# Patient Record
Sex: Female | Born: 1970
Health system: Southern US, Community
[De-identification: ages and names within clinical notes are randomized; demographics above are authoritative.]

## PROBLEM LIST (undated history)

## (undated) DIAGNOSIS — J309 Allergic rhinitis, unspecified: Secondary | ICD-10-CM

## (undated) HISTORY — DX: Allergic rhinitis, unspecified: J30.9

## (undated) HISTORY — PX: CHOLECYSTECTOMY: SHX55

## (undated) HISTORY — PX: WISDOM TOOTH EXTRACTION: SHX21

## (undated) HISTORY — PX: FOOT SURGERY: SHX648

---

## 2007-11-06 ENCOUNTER — Ambulatory Visit: Payer: Self-pay

## 2007-11-08 ENCOUNTER — Inpatient Hospital Stay: Payer: Self-pay | Admitting: Obstetrics and Gynecology

## 2008-04-24 ENCOUNTER — Ambulatory Visit: Payer: Self-pay | Admitting: General Surgery

## 2008-04-25 ENCOUNTER — Ambulatory Visit: Payer: Self-pay | Admitting: General Surgery

## 2008-06-03 ENCOUNTER — Ambulatory Visit: Payer: Self-pay | Admitting: General Surgery

## 2008-09-12 DIAGNOSIS — F341 Dysthymic disorder: Secondary | ICD-10-CM | POA: Insufficient documentation

## 2008-10-25 DIAGNOSIS — L679 Hair color and hair shaft abnormality, unspecified: Secondary | ICD-10-CM | POA: Insufficient documentation

## 2009-04-18 ENCOUNTER — Ambulatory Visit: Payer: Self-pay | Admitting: Family Medicine

## 2009-04-18 DIAGNOSIS — M545 Low back pain, unspecified: Secondary | ICD-10-CM | POA: Insufficient documentation

## 2009-07-03 DIAGNOSIS — J309 Allergic rhinitis, unspecified: Secondary | ICD-10-CM | POA: Insufficient documentation

## 2014-11-25 ENCOUNTER — Other Ambulatory Visit: Payer: Self-pay | Admitting: Family Medicine

## 2014-11-25 DIAGNOSIS — L0293 Carbuncle, unspecified: Secondary | ICD-10-CM

## 2014-11-25 MED ORDER — DOXYCYCLINE HYCLATE 100 MG PO TABS: 100.0000 mg | ORAL_TABLET | Freq: Two times a day (BID) | ORAL | Status: DC

## 2015-01-07 ENCOUNTER — Encounter: Payer: Self-pay | Admitting: Family Medicine

## 2015-01-07 ENCOUNTER — Ambulatory Visit (INDEPENDENT_AMBULATORY_CARE_PROVIDER_SITE_OTHER): Payer: Managed Care, Other (non HMO) | Admitting: Family Medicine

## 2015-01-07 DIAGNOSIS — L0293 Carbuncle, unspecified: Secondary | ICD-10-CM

## 2015-01-07 DIAGNOSIS — L02429 Furuncle of limb, unspecified: Secondary | ICD-10-CM | POA: Insufficient documentation

## 2015-01-07 LAB — POCT GLYCOSYLATED HEMOGLOBIN (HGB A1C): Hemoglobin A1C: 5.2

## 2015-01-07 MED ORDER — DOXYCYCLINE HYCLATE 100 MG PO TABS: 100.0000 mg | ORAL_TABLET | Freq: Two times a day (BID) | ORAL | Status: DC

## 2015-01-07 NOTE — Patient Instructions (Signed)
We can consider infectious disease referral if you wish.

## 2015-01-07 NOTE — Progress Notes (Signed)
Subjective:     Patient ID: Laurie Wilkins, female   DOB: [birth year], 44 y.o.   MRN: [id number]  HPI  Chief Complaint  Patient presents with  . Skin Check    Patient comes in office today with concerns of possible boil on left hip. She states that she first noticed boil on 09/16 and she had popped it and it had drained. Patient reports that site continues to drain and now there is a hole present at site. Patient does have a history of MRSA  Last two cultures have discovered Non-MRSA/PCN resistant staph.States her husband is not flaring with his boils at this time. She is frustrated with recurrent boils and wishes further investigation.   Review of Systems  Constitutional: Negative for fever and chills.       Objective:   Physical Exam  Constitutional: She appears well-developed and well-nourished. No distress.  Skin:  Open carbuncle on left flank with underlying induration but no no drainage.       Assessment:    1. Carbuncle - doxycycline (VIBRA-TABS) 100 MG tablet; Take 1 tablet (100 mg total) by mouth 2 (two) times daily.  Dispense: 14 tablet; Refill: 0 - POCT glycosylated hemoglobin (Hb A1C) - CBC with Differential/Platelet    Plan:    Consider I.D. Referral.

## 2015-04-30 ENCOUNTER — Ambulatory Visit (INDEPENDENT_AMBULATORY_CARE_PROVIDER_SITE_OTHER): Payer: Managed Care, Other (non HMO) | Admitting: Family Medicine

## 2015-04-30 ENCOUNTER — Encounter: Payer: Self-pay | Admitting: Family Medicine

## 2015-04-30 VITALS — BP 104/70 | HR 58 | Temp 98.1°F | Resp 16 | Wt 218.6 lb

## 2015-04-30 DIAGNOSIS — S86112A Strain of other muscle(s) and tendon(s) of posterior muscle group at lower leg level, left leg, initial encounter: Secondary | ICD-10-CM | POA: Diagnosis not present

## 2015-04-30 NOTE — Progress Notes (Signed)
Subjective:     Patient ID: Laurie Wilkins, female   DOB: 1970/04/29, 45 y.o.   MRN: [id number]  HPI  Chief Complaint  Patient presents with  . Leg Pain    Patient comes in office today with concerns of leg pain on her left side for the past 6 weeks. Patient states that she has been actively exercising 5x a week doing bootcamp class at gym and spin class. Patient describes pain as throbbing she had been taking otc Aleve and Ibuprofen and took a break from exercising to see if symptoms improved. Patient states that last week she returned back to full exercise 5x a week and states that pain has returned and now she has noticed bruising on her leg   Reports additional misadventures with her left leg over the course of the last two weeks: walking in the snow and her leg slipping and dropping while shaving.   Review of Systems     Objective:   Physical Exam  Cardiovascular:  Pulses:      Dorsalis pedis pulses are 2+ on the left side.       Posterior tibial pulses are 2+ on the left side.  Musculoskeletal: She exhibits no edema (of lower extremity).  KF/KE/HF/HE 5/5. Tender over her left lateral posterior calf which causes pain to radiate up her hamstring. No ischial tenderness. Increased pain with resistance to knee flexion.       Assessment:    1. Gastrocnemius strain, left, initial encounter     Plan:    Discussed use of nsaid's, heat, and cross training.

## 2015-04-30 NOTE — Patient Instructions (Addendum)
Discussed taking two Aleve twice daily with food. Consider heat for 20 minutes several x day. Do upper body exercises only for at least a week.

## 2015-05-07 ENCOUNTER — Telehealth: Payer: Self-pay | Admitting: Family Medicine

## 2015-05-07 ENCOUNTER — Other Ambulatory Visit: Payer: Self-pay | Admitting: Family Medicine

## 2015-05-07 DIAGNOSIS — M25562 Pain in left knee: Secondary | ICD-10-CM

## 2015-05-07 NOTE — Telephone Encounter (Signed)
Referral in progress. 

## 2015-05-07 NOTE — Telephone Encounter (Signed)
Pt's husband Brett Canales called stating that pt is still having pain in left leg/thigh.She is wanting a referral to Dr Erin Sons.Call back #  [phone]

## 2015-05-07 NOTE — Telephone Encounter (Signed)
Patient has been notified of referral. Laurie Wilkins

## 2015-05-12 ENCOUNTER — Other Ambulatory Visit: Payer: Self-pay | Admitting: Unknown Physician Specialty

## 2015-05-12 DIAGNOSIS — M25552 Pain in left hip: Principal | ICD-10-CM

## 2015-05-12 DIAGNOSIS — G8929 Other chronic pain: Secondary | ICD-10-CM

## 2015-05-12 DIAGNOSIS — M79605 Pain in left leg: Secondary | ICD-10-CM

## 2015-05-16 ENCOUNTER — Ambulatory Visit
Admission: RE | Admit: 2015-05-16 | Discharge: 2015-05-16 | Disposition: A | Discharge status: Home / Self Care | Payer: Managed Care, Other (non HMO) | Source: Ambulatory Visit | Attending: Unknown Physician Specialty | Admitting: Unknown Physician Specialty

## 2015-05-16 DIAGNOSIS — M79605 Pain in left leg: Secondary | ICD-10-CM | POA: Diagnosis not present

## 2015-05-16 DIAGNOSIS — M4806 Spinal stenosis, lumbar region: Secondary | ICD-10-CM | POA: Insufficient documentation

## 2015-05-16 DIAGNOSIS — G8929 Other chronic pain: Secondary | ICD-10-CM | POA: Insufficient documentation

## 2015-05-16 DIAGNOSIS — M25552 Pain in left hip: Secondary | ICD-10-CM | POA: Diagnosis present

## 2015-05-16 DIAGNOSIS — M4690 Unspecified inflammatory spondylopathy, site unspecified: Secondary | ICD-10-CM | POA: Insufficient documentation

## 2015-05-30 ENCOUNTER — Other Ambulatory Visit: Payer: Self-pay

## 2015-05-30 ENCOUNTER — Ambulatory Visit: Payer: Self-pay

## 2016-10-20 ENCOUNTER — Ambulatory Visit (INDEPENDENT_AMBULATORY_CARE_PROVIDER_SITE_OTHER): Payer: Managed Care, Other (non HMO) | Admitting: Physician Assistant

## 2016-10-20 ENCOUNTER — Encounter: Payer: Self-pay | Admitting: Physician Assistant

## 2016-10-20 VITALS — BP 122/80 | HR 84 | Temp 98.7°F | Resp 16 | Wt 225.8 lb

## 2016-10-20 DIAGNOSIS — J0100 Acute maxillary sinusitis, unspecified: Secondary | ICD-10-CM

## 2016-10-20 DIAGNOSIS — A4902 Methicillin resistant Staphylococcus aureus infection, unspecified site: Secondary | ICD-10-CM | POA: Diagnosis not present

## 2016-10-20 NOTE — Progress Notes (Signed)
Patient: Laurie Wilkins Female    DOB: 1971/03/27   46 y.o.   MRN: [id number] Visit Date: 10/20/2016  Today's Provider: Margaretann LovelessJennifer M Casimiro Lienhard, PA-C   Chief Complaint  Patient presents with  . Sinusitis   Subjective:    Sinusitis  This is a new problem. The current episode started 1 to 4 weeks ago (Last Monday). The problem has been gradually worsening since onset. There has been no fever. Associated symptoms include congestion, coughing, diaphoresis, ear pain (pressure on left ear ), headaches, a hoarse voice, sinus pressure, sneezing, a sore throat and swollen glands. Pertinent negatives include no chills or shortness of breath. Treatments tried: Meloxicam,Loratadine tablets 10mg , Mupirocin ointment in her nose and on the bump that appeared underneath armpiit. The treatment provided no relief.       Allergies  Allergen Reactions  . Naproxen Sodium     Other reaction(s): Abdominal Pain Bleeding in bowels and bladder  . Erythromycin Nausea And Vomiting     Current Outpatient Prescriptions:  .  meloxicam (MOBIC) 7.5 MG tablet, TAKE 1 TABLET (7.5 MG TOTAL) BY MOUTH ONCE DAILY., Disp: , Rfl:  .  Multiple Vitamins-Minerals (MULTIVITAL-M) TABS, Take by mouth., Disp: , Rfl:  .  ferrous sulfate 325 (65 FE) MG tablet, Take by mouth., Disp: , Rfl:  .  HYDROcodone-acetaminophen (NORCO/VICODIN) 5-325 MG tablet, 1 po bid prn, Disp: , Rfl:   Review of Systems  Constitutional: Positive for diaphoresis and fatigue. Negative for chills and fever.  HENT: Positive for congestion, ear pain (pressure on left ear ), hoarse voice, postnasal drip, rhinorrhea, sinus pain, sinus pressure, sneezing, sore throat, trouble swallowing and voice change.   Respiratory: Positive for cough. Negative for chest tightness, shortness of breath and wheezing.   Cardiovascular: Negative for chest pain, palpitations and leg swelling.  Gastrointestinal: Positive for diarrhea (today). Negative for nausea and  vomiting.  Skin: Positive for rash (hx of MRSA).  Neurological: Positive for headaches.    Social History  Substance Use Topics  . Smoking status: Never Smoker  . Smokeless tobacco: Never Used  . Alcohol use No   Objective:   BP 122/80 (BP Location: Right Arm, Patient Position: Sitting, Cuff Size: Large)   Pulse 84   Temp 98.7 F (37.1 C) (Oral)   Resp 16   Wt 225 lb 12.8 oz (102.4 kg)   LMP 09/17/2016  Vitals:   10/20/16 1527  BP: 122/80  Pulse: 84  Resp: 16  Temp: 98.7 F (37.1 C)  TempSrc: Oral  Weight: 225 lb 12.8 oz (102.4 kg)     Physical Exam  Constitutional: She appears well-developed and well-nourished. No distress.  HENT:  Head: Normocephalic and atraumatic.  Right Ear: Hearing, tympanic membrane, external ear and ear canal normal.  Left Ear: Hearing, tympanic membrane, external ear and ear canal normal.  Nose: Right sinus exhibits maxillary sinus tenderness and frontal sinus tenderness. Left sinus exhibits maxillary sinus tenderness and frontal sinus tenderness.  Mouth/Throat: Uvula is midline, oropharynx is clear and moist and mucous membranes are normal. No oropharyngeal exudate.  Neck: Normal range of motion. Neck supple. No tracheal deviation present. No thyromegaly present.  Cardiovascular: Normal rate, regular rhythm and normal heart sounds.  Exam reveals no gallop and no friction rub.   No murmur heard. Pulmonary/Chest: Effort normal and breath sounds normal. No stridor. No respiratory distress. She has no wheezes. She has no rales.  Lymphadenopathy:    She has no cervical adenopathy.  Skin: She is not diaphoretic.  Vitals reviewed.       Assessment & Plan:     1. Acute maxillary sinusitis, recurrence not specified Worsening symptoms that have not responded to OTC medications. Will give augmentin as below. Continue allergy medications. Stay well hydrated and get plenty of rest. Call if no symptom improvement or if symptoms worsen.  Epic went  down during visit and she was given written Rx for augmentin.   2. Infection with methicillin-resistant Staphylococcus aureus Written Rx for Mupirocin 2% ointment given for refill. This has been working well when she has outbreaks and she has been using in her nose.        Margaretann Loveless, PA-C  [facility]

## 2016-12-09 ENCOUNTER — Ambulatory Visit (INDEPENDENT_AMBULATORY_CARE_PROVIDER_SITE_OTHER): Payer: Managed Care, Other (non HMO) | Admitting: Physician Assistant

## 2016-12-09 VITALS — BP 128/78 | HR 92 | Temp 98.1°F | Resp 16 | Ht 65.0 in | Wt 232.0 lb

## 2016-12-09 DIAGNOSIS — N3000 Acute cystitis without hematuria: Secondary | ICD-10-CM | POA: Diagnosis not present

## 2016-12-09 LAB — POCT URINALYSIS DIPSTICK
Bilirubin, UA: NEGATIVE
Blood, UA: NEGATIVE
Glucose, UA: NEGATIVE
KETONES UA: NEGATIVE
Nitrite, UA: NEGATIVE
PROTEIN UA: NEGATIVE
Spec Grav, UA: <=1.005 (ref 1.010–1.025)
Urobilinogen, UA: 0.2 E.U./dL
pH, UA: 6 (ref 5.0–8.0)

## 2016-12-09 MED ORDER — PHENAZOPYRIDINE HCL 100 MG PO TABS: 100.0000 mg | ORAL_TABLET | Freq: Three times a day (TID) | ORAL | 0 refills | Status: DC | PRN

## 2016-12-09 MED ORDER — SULFAMETHOXAZOLE-TRIMETHOPRIM 800-160 MG PO TABS: 1.0000 | ORAL_TABLET | Freq: Two times a day (BID) | ORAL | 0 refills | Status: DC

## 2016-12-09 NOTE — Patient Instructions (Signed)

## 2016-12-09 NOTE — Progress Notes (Signed)
Patient: Laurie Wilkins Female    DOB: Nov 26, 1970   46 y.o.   MRN: [id number] Visit Date: 12/09/2016  Today's Provider: Margaretann Loveless, PA-C   Chief Complaint  Patient presents with  . Urinary Tract Infection   Subjective:    HPI Urinary Tract Infection: Patient complains of frequency She has had symptoms for 4 days. Patient also complains of back pain. Patient denies fever and vaginal discharge. Patient does not have a history of recurrent UTI.  Patient does not have a history of pyelonephritis. Patient reports she did a home urine test and turned out positive for white cells. Patient reports back pain is intense and worse with activity. Patient reports taking OTC Tylenol and Ibuprofen reports no relief.      Allergies  Allergen Reactions  . Naproxen Sodium     Other reaction(s): Abdominal Pain Bleeding in bowels and bladder  . Erythromycin Nausea And Vomiting     Current Outpatient Prescriptions:  .  ferrous sulfate 325 (65 FE) MG tablet, Take by mouth., Disp: , Rfl:  .  meloxicam (MOBIC) 7.5 MG tablet, TAKE 1 TABLET (7.5 MG TOTAL) BY MOUTH ONCE DAILY., Disp: , Rfl:  .  Multiple Vitamins-Minerals (MULTIVITAL-M) TABS, Take by mouth., Disp: , Rfl:   Review of Systems  Constitutional: Negative.   Respiratory: Negative.   Cardiovascular: Negative.   Gastrointestinal: Negative.   Genitourinary: Positive for frequency and urgency.  Musculoskeletal: Positive for back pain.    Social History  Substance Use Topics  . Smoking status: Never Smoker  . Smokeless tobacco: Never Used  . Alcohol use No   Objective:   BP 128/78 (BP Location: Left Arm, Patient Position: Sitting, Cuff Size: Large)   Pulse 92   Temp 98.1 F (36.7 C) (Oral)   Resp 16   Ht 5\' 5"  (1.651 m)   Wt 232 lb (105.2 kg)   LMP 11/21/2016 (Exact Date)   SpO2 97%   BMI 38.61 kg/m  Vitals:   12/09/16 1557  BP: 128/78  Pulse: 92  Resp: 16  Temp: 98.1 F (36.7 C)  TempSrc: Oral  SpO2:  97%  Weight: 232 lb (105.2 kg)  Height: 5\' 5"  (1.651 m)     Physical Exam  Constitutional: She is oriented to person, place, and time. She appears well-developed and well-nourished. No distress.  Cardiovascular: Normal rate, regular rhythm and normal heart sounds.  Exam reveals no gallop and no friction rub.   No murmur heard. Pulmonary/Chest: Effort normal and breath sounds normal. No respiratory distress. She has no wheezes. She has no rales.  Abdominal: Soft. Normal appearance and bowel sounds are normal. She exhibits no distension and no mass. There is no hepatosplenomegaly. There is tenderness in the suprapubic area. There is no rebound, no guarding and no CVA tenderness.  Neurological: She is alert and oriented to person, place, and time.  Skin: Skin is warm and dry. She is not diaphoretic.  Vitals reviewed.      Assessment & Plan:     1. Acute cystitis without hematuria Worsening symptoms. UA positive. Will treat empirically with Bactrim as below. Pyridium given for spasm. Continue to push fluids. Urine sent for culture. Will follow up pending C&S results. She is to call if symptoms do not improve or if they worsen.  - sulfamethoxazole-trimethoprim (BACTRIM DS,SEPTRA DS) 800-160 MG tablet; Take 1 tablet by mouth 2 (two) times daily.  Dispense: 14 tablet; Refill: 0 - phenazopyridine (PYRIDIUM) 100 MG  tablet; Take 1 tablet (100 mg total) by mouth 3 (three) times daily as needed for pain.  Dispense: 10 tablet; Refill: 0 - Urine Culture       Margaretann LovelessJennifer M Kamori Kitchens, PA-C  [facility]

## 2016-12-13 LAB — URINE CULTURE

## 2016-12-14 ENCOUNTER — Telehealth: Payer: Self-pay

## 2016-12-14 NOTE — Telephone Encounter (Signed)
Patient advised as directed below.  Thanks,  -Joseline 

## 2016-12-14 NOTE — Telephone Encounter (Signed)
-----   Message from Margaretann LovelessJennifer M Burnette, [place]PA-C sent at 12/14/2016  8:27 AM EDT ----- Urine culture was positive for strep bacteria but should be susceptible to the antibiotic you were placed on. Continue until completed and call if symptoms do not resolve.

## 2017-01-26 ENCOUNTER — Ambulatory Visit
Admission: RE | Admit: 2017-01-26 | Discharge: 2017-01-26 | Disposition: A | Discharge status: Home / Self Care | Payer: Managed Care, Other (non HMO) | Source: Ambulatory Visit | Attending: Physician Assistant | Admitting: Physician Assistant

## 2017-01-26 ENCOUNTER — Ambulatory Visit (INDEPENDENT_AMBULATORY_CARE_PROVIDER_SITE_OTHER): Payer: Managed Care, Other (non HMO) | Admitting: Physician Assistant

## 2017-01-26 VITALS — BP 126/88 | HR 84 | Temp 98.2°F | Resp 16 | Wt 227.8 lb

## 2017-01-26 DIAGNOSIS — R197 Diarrhea, unspecified: Secondary | ICD-10-CM | POA: Insufficient documentation

## 2017-01-26 DIAGNOSIS — N3000 Acute cystitis without hematuria: Secondary | ICD-10-CM | POA: Diagnosis not present

## 2017-01-26 DIAGNOSIS — R110 Nausea: Secondary | ICD-10-CM

## 2017-01-26 DIAGNOSIS — R1032 Left lower quadrant pain: Secondary | ICD-10-CM | POA: Diagnosis not present

## 2017-01-26 DIAGNOSIS — R309 Painful micturition, unspecified: Secondary | ICD-10-CM

## 2017-01-26 LAB — POCT URINALYSIS DIPSTICK
Bilirubin, UA: NEGATIVE
Glucose, UA: NEGATIVE
KETONES UA: NEGATIVE
Nitrite, UA: NEGATIVE
PH UA: 6 (ref 5.0–8.0)
PROTEIN UA: NEGATIVE
SPEC GRAV UA: 1.01 (ref 1.010–1.025)
Urobilinogen, UA: 0.2 E.U./dL

## 2017-01-26 MED ORDER — NITROFURANTOIN MONOHYD MACRO 100 MG PO CAPS: 100.0000 mg | ORAL_CAPSULE | Freq: Two times a day (BID) | ORAL | 0 refills | Status: DC

## 2017-01-26 NOTE — Progress Notes (Signed)
Patient: Laurie Wilkins Female    DOB: 05-Sep-1970   46 y.o.   MRN: [id number] Visit Date: 01/26/2017  Today's Provider: Margaretann Loveless, PA-C   Chief Complaint  Patient presents with  . Urinary Frequency   Subjective:    HPI  Patient states she thinks she has another UTI. Her last one was on 12/09/16 for strep b infection. She did a course of antibiotic at that time and did feel better after. Current symptoms started on Friday 01/21/17 with nausea and vomiting, by Monday 01/24/17 developed urinary frequency, urgency, burning, back pain, bloated sensation, sweat at night time and headache now. She is still having nausea symptoms and not eating as much due to this. Her last menstrual cycle ended on 01/21/17.   She also complains of LLQ pain, diarrhea and left flank pain.     Allergies  Allergen Reactions  . Naproxen Sodium     Other reaction(s): Abdominal Pain Bleeding in bowels and bladder  . Erythromycin Nausea And Vomiting     Current Outpatient Prescriptions:  .  ferrous sulfate 325 (65 FE) MG tablet, Take by mouth., Disp: , Rfl:  .  meloxicam (MOBIC) 7.5 MG tablet, TAKE 1 TABLET (7.5 MG TOTAL) BY MOUTH ONCE DAILY., Disp: , Rfl:  .  Multiple Vitamins-Minerals (MULTIVITAL-M) TABS, Take by mouth., Disp: , Rfl:   Review of Systems  Constitutional: Positive for fatigue.  Respiratory: Negative.   Cardiovascular: Negative.   Gastrointestinal: Positive for abdominal distention, abdominal pain, diarrhea, nausea and vomiting.  Genitourinary: Positive for dysuria, flank pain, frequency and urgency. Negative for vaginal bleeding, vaginal discharge and vaginal pain.  Musculoskeletal: Positive for back pain.    Social History  Substance Use Topics  . Smoking status: Never Smoker  . Smokeless tobacco: Never Used  . Alcohol use No   Objective:   BP 126/88   Pulse 84   Temp 98.2 F (36.8 C)   Resp 16   Wt 227 lb 12.8 oz (103.3 kg)   LMP 01/16/2017   BMI 37.91  kg/m  Vitals:   01/26/17 1454  BP: 126/88  Pulse: 84  Resp: 16  Temp: 98.2 F (36.8 C)  Weight: 227 lb 12.8 oz (103.3 kg)     Physical Exam  Constitutional: She is oriented to person, place, and time. She appears well-developed and well-nourished. No distress.  Cardiovascular: Normal rate, regular rhythm and normal heart sounds.  Exam reveals no gallop and no friction rub.   No murmur heard. Pulmonary/Chest: Effort normal and breath sounds normal. No respiratory distress. She has no wheezes. She has no rales.  Abdominal: Soft. Normal appearance and bowel sounds are normal. She exhibits no distension and no mass. There is no hepatosplenomegaly. There is tenderness in the left lower quadrant. There is CVA tenderness (left side). There is no rebound and no guarding.  Neurological: She is alert and oriented to person, place, and time.  Skin: Skin is warm and dry. She is not diaphoretic.  Vitals reviewed.      Assessment & Plan:     1. Acute cystitis without hematuria Worsening symptoms. UA positive. Will treat empirically with Macrobid as below. Continue to push fluids. Urine sent for culture. Will follow up pending C&S results. She is to call if symptoms do not improve or if they worsen.  - Urine Culture - nitrofurantoin, macrocrystal-monohydrate, (MACROBID) 100 MG capsule; Take 1 capsule (100 mg total) by mouth 2 (two) times daily.  Dispense:  14 capsule; Refill: 0  2. Painful urination UA positive for small leuks and NH trace. - POCT urinalysis dipstick  3. LLQ pain LLQ tenderness without guarding on exam. Will order xray as below to r/o diverticulitis vs renal stone as possible cause of these symptoms. May possibly be caused by UTI, but want to r/o other causes.   - DG Abd 2 Views; Future  4. Nausea See above medical treatment plan. - DG Abd 2 Views; Future  5. Diarrhea, unspecified type See above medical treatment plan. - DG Abd 2 Views; Future       Margaretann LovelessJennifer M  Wrigley Winborne, PA-C  [facility]

## 2017-01-26 NOTE — Patient Instructions (Signed)

## 2017-01-28 LAB — URINE CULTURE
MICRO NUMBER:: 81160368
SPECIMEN QUALITY:: ADEQUATE

## 2017-01-31 ENCOUNTER — Telehealth: Payer: Self-pay

## 2017-01-31 NOTE — Telephone Encounter (Signed)
-----   Message from Margaretann LovelessJennifer M Burnette, [place]PA-C sent at 01/30/2017  6:35 PM EDT ----- Urine culture positive for group B strep again. Should be sensitive to the antibiotic but call if symptoms have not improved completely.

## 2017-01-31 NOTE — Telephone Encounter (Signed)
Patient advised as below. Patient reports symptoms are improving. sd

## 2017-06-02 ENCOUNTER — Other Ambulatory Visit: Payer: Self-pay | Admitting: Physician Assistant

## 2017-10-19 ENCOUNTER — Other Ambulatory Visit: Payer: Self-pay | Admitting: Physician Assistant

## 2017-12-09 ENCOUNTER — Encounter: Payer: Self-pay | Admitting: Family Medicine

## 2017-12-09 ENCOUNTER — Ambulatory Visit: Payer: Managed Care, Other (non HMO) | Admitting: Family Medicine

## 2017-12-09 VITALS — BP 126/70 | HR 78 | Temp 98.7°F | Resp 16 | Wt 233.0 lb

## 2017-12-09 DIAGNOSIS — R300 Dysuria: Secondary | ICD-10-CM | POA: Diagnosis not present

## 2017-12-09 DIAGNOSIS — G8929 Other chronic pain: Secondary | ICD-10-CM

## 2017-12-09 DIAGNOSIS — M545 Low back pain, unspecified: Secondary | ICD-10-CM

## 2017-12-09 LAB — POCT URINALYSIS DIPSTICK
Bilirubin, UA: NEGATIVE
Glucose, UA: NEGATIVE
Ketones, UA: NEGATIVE
Leukocytes, UA: NEGATIVE
NITRITE UA: NEGATIVE
PH UA: 7 (ref 5.0–8.0)
PROTEIN UA: NEGATIVE
Spec Grav, UA: <=1.005 — AB (ref 1.010–1.025)
UROBILINOGEN UA: 0.2 U/dL

## 2017-12-09 MED ORDER — CEPHALEXIN 500 MG PO CAPS: 500.0000 mg | ORAL_CAPSULE | Freq: Two times a day (BID) | ORAL | 0 refills | Status: DC

## 2017-12-09 NOTE — Patient Instructions (Signed)
Uses Miralax as needed to keep your bowels moving.

## 2017-12-09 NOTE — Progress Notes (Signed)
  Subjective:     Patient ID: Laurie SchaumannAngela S Moder, female   DOB: Jul 31, 1970, 47 y.o.   MRN: [id number] Chief Complaint  Patient presents with  . Urinary Tract Infection    Patient comes in today c/o UTI symptoms. She has had symptoms X 4 days. She reports that she has low back pain, fatigue, and chills. She also mentions that she has burning on urination as well. Denies hematuria. She has taken a home AZO test and it was positive for UTI.    HPI States right low back pain is chronic after gall bladder surgery and sometimes associates with constipation: "I have a twisted bowel." Has recently been very active on vacation with her family but denies specific injury. Last moved her bowels this AM. Reports exposure at work to upper respiratory illness. States she has increased her fluids.  Review of Systems     Objective:   Physical Exam  Constitutional: She appears well-developed and well-nourished. No distress.  Abdominal: Soft. Bowel sounds are normal.  Genitourinary:  Genitourinary Comments: miild left CVA tenderness on percussion  Ears: T.M's intact without inflammation Throat: no tonsillar enlargement or exudate Neck: no cervical adenopathy Lungs: clear     Assessment:    1. Dysuria: try cephalexin x 3 days - POCT urinalysis dipstick  2. Chronic right-sided low back pain without sciatica    Plan:    Discussed use of Miralax as needed.

## 2017-12-13 ENCOUNTER — Telehealth: Payer: Self-pay

## 2017-12-13 NOTE — Telephone Encounter (Signed)
FYI. KW 

## 2017-12-13 NOTE — Telephone Encounter (Signed)
Patient reports she is feeling much better with antibiotic and laxative.

## 2018-05-01 IMAGING — CR DG ABDOMEN 2V
1 series · 3 of 3 positions shown · non-contrast
Comparison: Limited views of the abdomen from a lumbar spine series
of April 18, 2009

CLINICAL DATA: Nausea and vomiting for the past 5 days with onset
of diarrhea and left lower quadrant pain last night. History of a
twisted colon. Previous cholecystectomy. Two urinary tract
infections over this past month.

EXAM:
ABDOMEN - 2 VIEW

[Series 1: dg abd 2 views · 0.14mm/px · 3 of 3 slices shown]
[im 1/3]
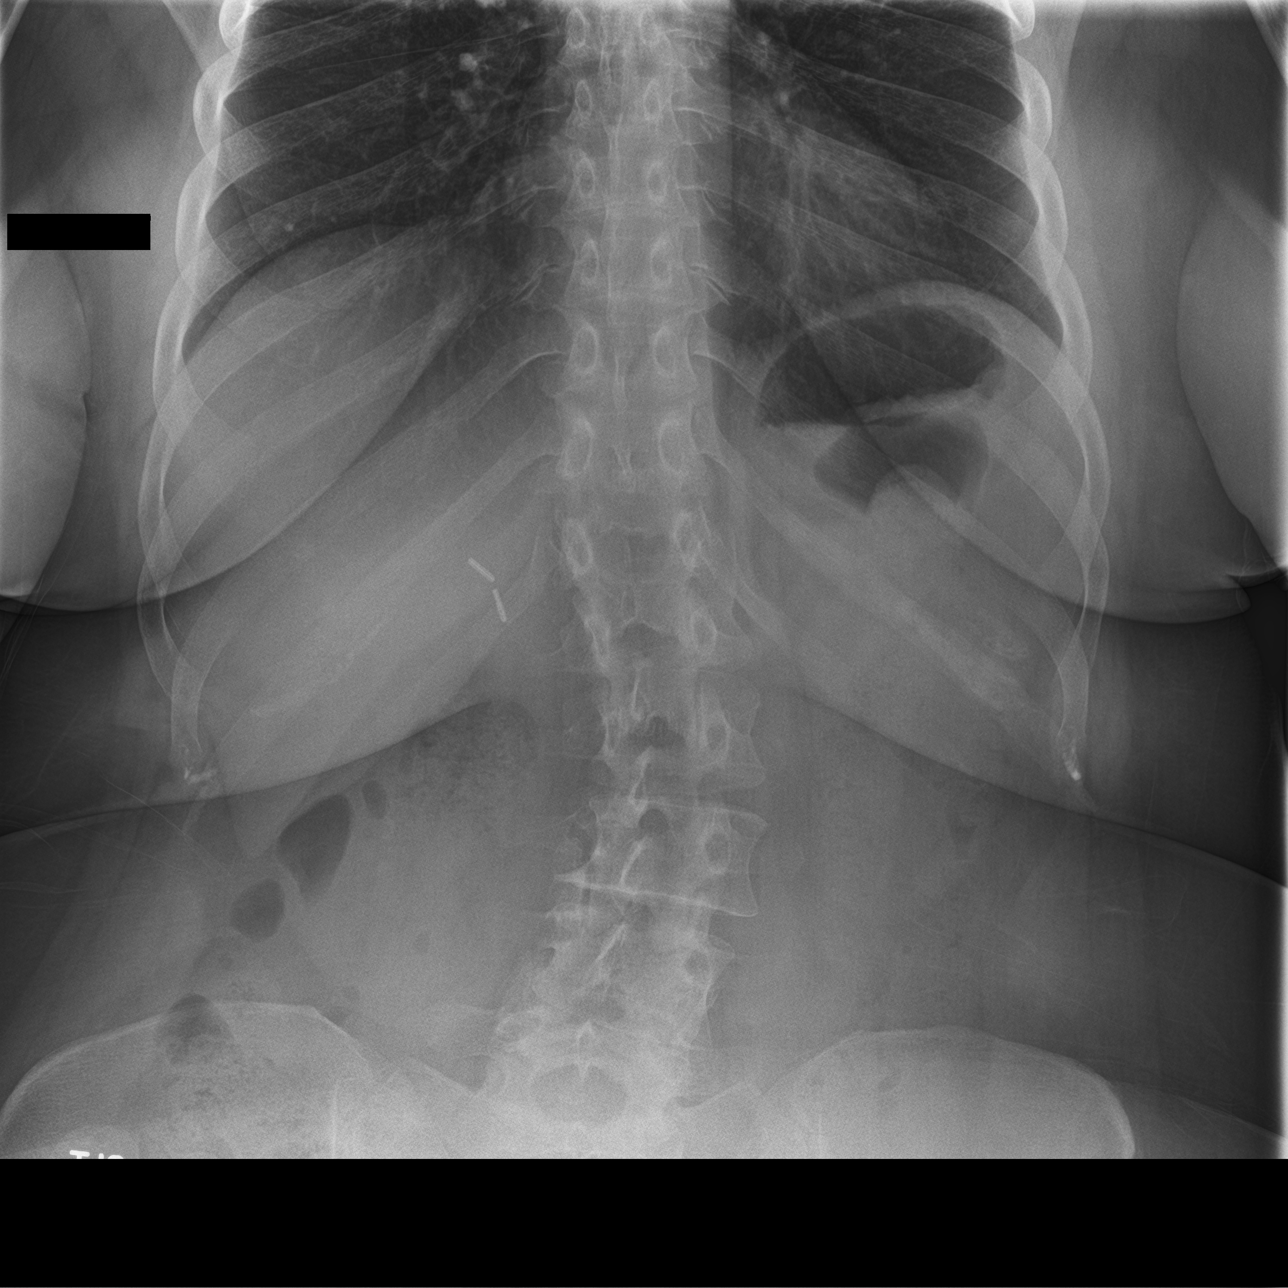
[im 2/3]
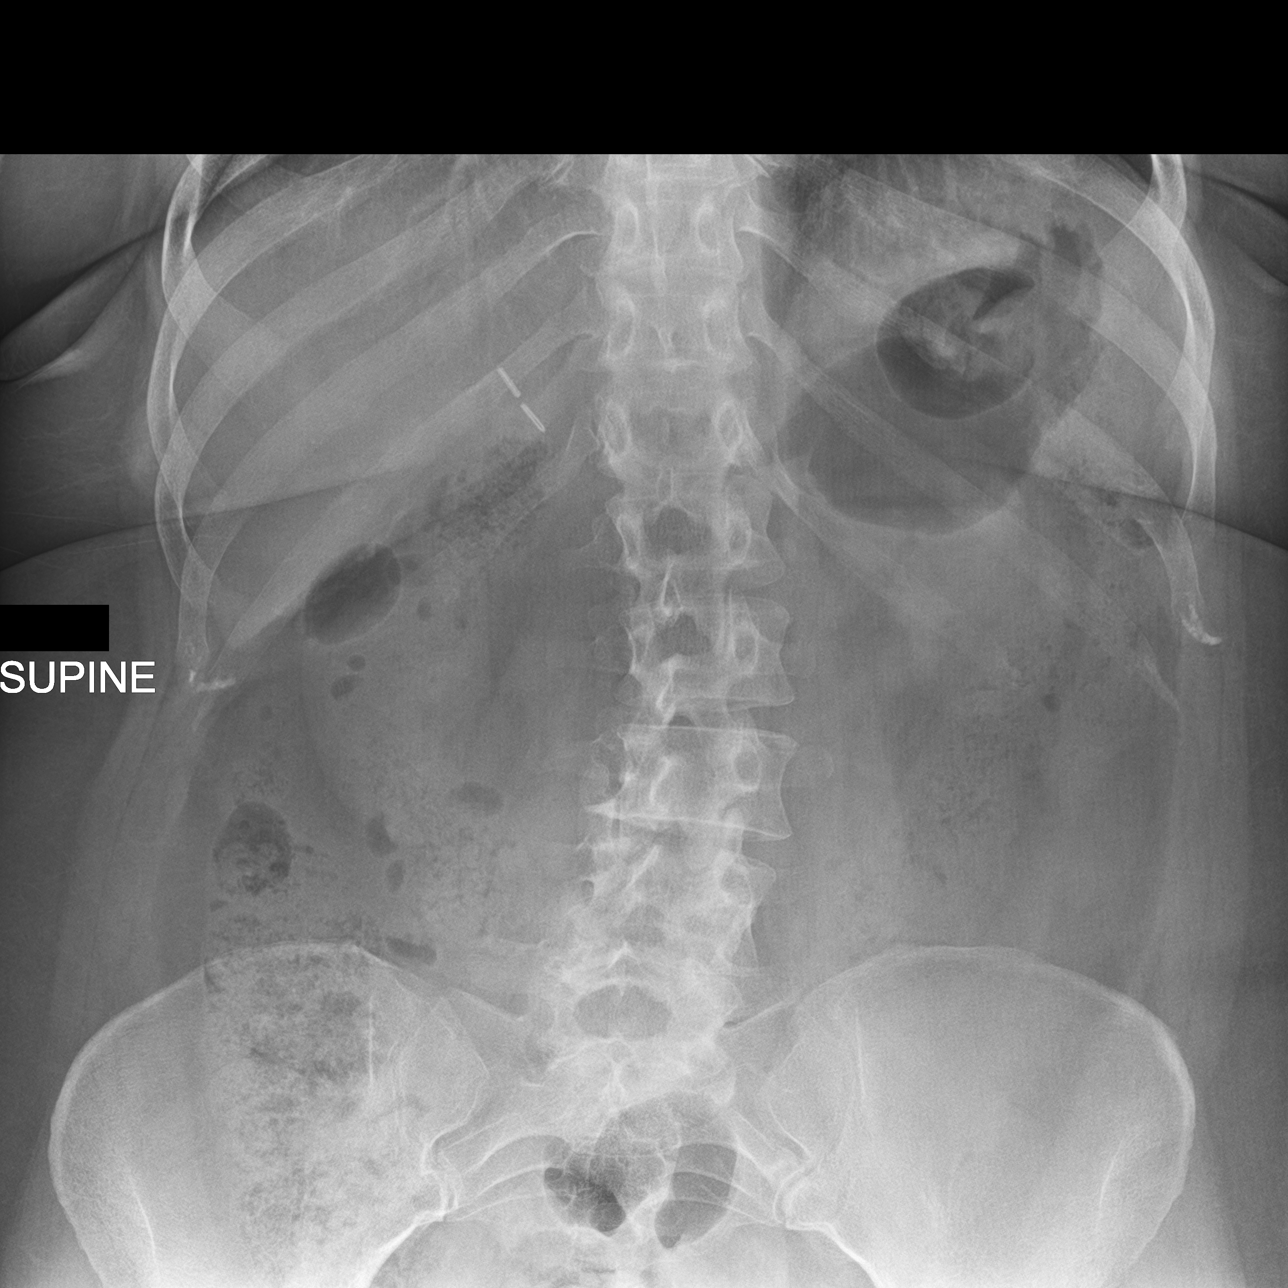
[im 3/3]
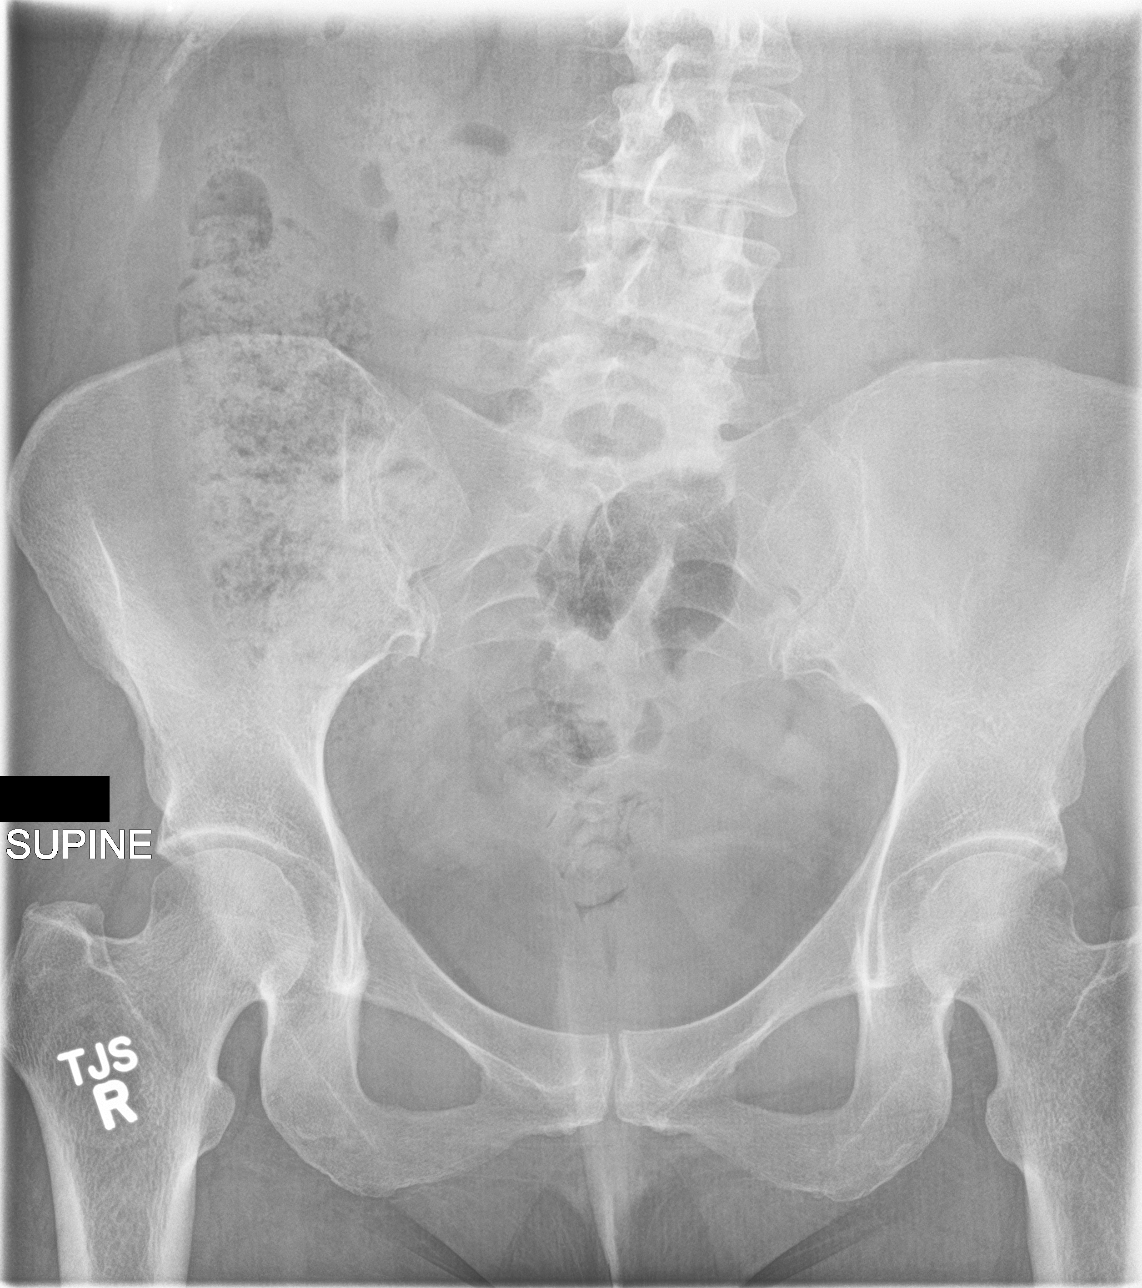

[3 of 3 positions shown; findings below may reference images not displayed]

FINDINGS: The colonic stool burden is moderate. There is no small or large
bowel obstructive pattern and no evidence of a fecal impaction. No
extraluminal gas collections are observed. There surgical clips in
the gallbladder fossa. There are no abnormal soft tissue
calcifications. There is stable mild levocurvature centered in the
mid lumbar spine. The lung bases are clear.
IMPRESSION: There is no evidence of ileus, obstruction, or perforation. The
colonic stool burden is moderate. No definite acute intra-abdominal
abnormality.

## 2018-06-04 ENCOUNTER — Other Ambulatory Visit: Payer: Self-pay | Admitting: Physician Assistant

## 2018-07-27 ENCOUNTER — Telehealth: Payer: Self-pay | Admitting: Family Medicine

## 2018-07-27 NOTE — Telephone Encounter (Signed)
May need evisit or in person visit. Whichever patient prefers

## 2018-07-27 NOTE — Telephone Encounter (Signed)
Pt called saying she is having a break out of bumps on her stomach that Nadine Counts use to treat with a cream.  She said it has been a while since she has been broke out.  She uses [facility]  Please advise  [phone]     Thanks teri

## 2018-07-27 NOTE — Progress Notes (Signed)
Virtual Visit via Video Note  I connected with Laurie Wilkins on 07/28/18 at  8:40 AM EDT by a video enabled telemedicine application and verified that I am speaking with the correct person using two identifiers.   I discussed the limitations of evaluation and management by telemedicine and the availability of in person appointments. The patient expressed understanding and agreed to proceed.  Patient location: home [facility] Persons involved in the visit: patient, provider, Lexine BatonStacy Baldwin, [facility]PN,CMA  Margaretann LovelessJennifer M Aleksia Freiman, PA-C   Patient: Laurie Wilkins Female    DOB: September 21, 1970   48 y.o.   MRN: [id number] Visit Date: 07/27/2018  Today's Provider: Margaretann LovelessJennifer M Court Gracia, PA-C   No chief complaint on file.  Subjective:     HPI  Rash on abdominal area, and left side of upper buttocks for 2 weeks. Patient states that she has been treated for this in the past. She states that she is been using Bactroban cream on the bumps. She has a history of being a MRSA carrier and occasionally gets outbreaks during times of high stress.   Allergies  Allergen Reactions  . Naproxen Sodium     Other reaction(s): Abdominal Pain Bleeding in bowels and bladder  . Erythromycin Nausea And Vomiting  . Prednisone Nausea Only    Abdominal pain      Current Outpatient Medications:  .  ferrous sulfate 325 (65 FE) MG tablet, Take by mouth., Disp: , Rfl:  .  Multiple Vitamins-Minerals (MULTIVITAL-M) TABS, Take by mouth., Disp: , Rfl:  .  mupirocin ointment (BACTROBAN) 2 %, APPLY 1 APPLICATION TOPICALLY TO AFFECTED AREA DAILY AS NEEDED, Disp: 22 g, Rfl: 1 .  cephALEXin (KEFLEX) 500 MG capsule, Take 1 capsule (500 mg total) by mouth 2 (two) times daily. (Patient not taking: Reported on 07/27/2018), Disp: 6 capsule, Rfl: 0 .  meloxicam (MOBIC) 7.5 MG tablet, TAKE 1 TABLET (7.5 MG TOTAL) BY MOUTH ONCE DAILY., Disp: , Rfl:  .  nitrofurantoin, macrocrystal-monohydrate,  (MACROBID) 100 MG capsule, Take 1 capsule (100 mg total) by mouth 2 (two) times daily. (Patient not taking: Reported on 12/09/2017), Disp: 14 capsule, Rfl: 0  Review of Systems  Constitutional: Negative.   Respiratory: Negative.   Cardiovascular: Negative.   Gastrointestinal: Negative.   Skin: Positive for rash.  Neurological: Negative.     Social History   Tobacco Use  . Smoking status: Never Smoker  . Smokeless tobacco: Never Used  Substance Use Topics  . Alcohol use: No    Alcohol/week: 0.0 standard drinks      Objective:   Wt 235 lb (106.6 kg)   BMI 39.11 kg/m  Vitals:   07/27/18 1540  Weight: 235 lb (106.6 kg)     Physical Exam Vitals signs reviewed.  Constitutional:      Appearance: Normal appearance. She is well-developed. She is not ill-appearing.  HENT:     Head: Normocephalic and atraumatic.  Neck:     Musculoskeletal: Normal range of motion and neck supple.  Pulmonary:     Effort: Pulmonary effort is normal. No respiratory distress.  Skin:    Findings: Rash present. Rash is nodular.       Neurological:     Mental Status: She is alert.  Psychiatric:        Behavior: Behavior normal.        Thought Content: Thought content normal.        Judgment: Judgment normal.  Assessment & Plan    1. MRSA (methicillin resistant Staphylococcus aureus) infection Current outbreak. Will treat with doxycycline as below. Continue warm compresses, cleaning the wounds, good hand hygiene. Bactroban given for intermittent use between outbreaks.  - doxycycline (VIBRA-TABS) 100 MG tablet; Take 1 tablet (100 mg total) by mouth 2 (two) times daily.  Dispense: 20 tablet; Refill: 0 - mupirocin ointment (BACTROBAN) 2 %; APPLY 1 APPLICATION TOPICALLY TO AFFECTED AREA DAILY AS NEEDED  Dispense: 22 g; Refill: 1    I discussed the assessment and treatment plan with the patient. The patient was provided an opportunity to ask questions and all were answered. The patient  agreed with the plan and demonstrated an understanding of the instructions.   The patient was advised to call back or seek an in-person evaluation if the symptoms worsen or if the condition fails to improve as anticipated.  I provided 10 minutes of non-face-to-face time during this encounter.   Margaretann Loveless, PA-C  [facility]

## 2018-07-27 NOTE — Telephone Encounter (Signed)
Please advise 

## 2018-07-27 NOTE — Telephone Encounter (Signed)
Patient scheduled for e-visit tomorrow. I already started the note. She doesn't need to be called tomorrow.

## 2018-07-28 ENCOUNTER — Encounter: Payer: Self-pay | Admitting: Physician Assistant

## 2018-07-28 ENCOUNTER — Ambulatory Visit (INDEPENDENT_AMBULATORY_CARE_PROVIDER_SITE_OTHER): Payer: BLUE CROSS/BLUE SHIELD | Admitting: Physician Assistant

## 2018-07-28 ENCOUNTER — Telehealth: Payer: Managed Care, Other (non HMO) | Admitting: Family

## 2018-07-28 VITALS — Wt 235.0 lb

## 2018-07-28 DIAGNOSIS — A4902 Methicillin resistant Staphylococcus aureus infection, unspecified site: Secondary | ICD-10-CM

## 2018-07-28 DIAGNOSIS — L739 Follicular disorder, unspecified: Secondary | ICD-10-CM

## 2018-07-28 MED ORDER — DOXYCYCLINE HYCLATE 100 MG PO TABS: 100.0000 mg | ORAL_TABLET | Freq: Two times a day (BID) | ORAL | 0 refills | Status: DC

## 2018-07-28 MED ORDER — MUPIROCIN 2 % EX OINT: TOPICAL_OINTMENT | CUTANEOUS | 1 refills | Status: DC

## 2018-07-28 NOTE — Patient Instructions (Signed)
MRSA Infection, Adult  Methicillin-resistant Staphylococcus aureus (MRSA) infection is caused by a bacteria (staphylococcus aureus or staph) that no longer responds to common antibiotic medicines (drug-resistant bacteria). MRSA infection can be hard to treat.  Most times, MRSA can be on the skin or in the nose without causing problems. However, if MRSA enters the body through a cut or sore, it can cause a serious infection, such as:   Skin infections.   Bone or joint infections.   Pneumonia.   Bloodstream infections (sepsis).  There are two types of MRSA:   Health care-associated MRSA. This is an infection that you get during a stay in a hospital, rehabilitation facility, nursing home, or other health care facility.   Community-associated MRSA. This is an infection that occurs after exposure to the bacteria in daily activities, such as sports, child care centers, or sharing personal items with someone who is infected with MRSA.  What are the causes?  This condition is caused by a bacteria. Illness may develop after exposure to the bacteria through:   Skin-to-skin contact with someone who is infected with MRSA.   Touching surfaces that have the bacteria.   Having a procedure or equipment that allows MRSA to enter the body.   Having MRSA living on your skin, and getting a cut or scratch that allows bacteria to enter your body.  What increases the risk?  Health care-associated MRSA  You are more likely to develop this condition if you:   Have surgery or a procedure.   Have an IV or a thin tube (catheter) placed in your body.   Are elderly.   Are on kidney dialysis.   Have recently taken an antibiotic.   Live in a long-term care facility.  Community-associated MRSA  You are more likely to develop this condition if you:   Have an infected wound or skin ulcer.   Have a weak body defense system (immune system).   Have recently taken an antibiotic.   Play sports that involve skin-to-skin contact.   Live  in a crowded setting, like a dormitory or military barracks.   Share towels, razors, or sports equipment with other people.  What are the signs or symptoms?  Symptoms of this condition depend on the area that is affected. Symptoms may include:   A pus-filled pimple or boil.   Pus draining from your skin.   A sore (abscess) under your skin or somewhere in your body.   Fever with or without chills.  How is this diagnosed?  This condition may be diagnosed based on:   A physical exam and medical history.   Taking a sample from the infected area and growing it in a lab (culture).  You may also have other tests, including:   Imaging tests such as X-rays, CT scan, or MRI.   Lab tests such as blood, urine, or sputum tests.  You may be screened for MRSA on your skin or in your nose when you are admitted to a health care facility for a procedure.  How is this treated?  Treatment depends on the type of MRSA and how severe, deep, or extensive the infection is. Treatment may include:   Antibiotic medicines.   Surgery to drain pus from the infected area.  Severe infections may require a hospital stay.  Follow these instructions at home:  Medicines   Take over-the-counter and prescription medicines only as told by your health care provider.   Take antibiotic medicine as told by your   health care provider. Do not stop taking the antibiotic even if you start to feel better.  Hand washing     Wash your hands often with soap and water. If soap and water are not available, use an alcohol-based hand sanitizer.   Make sure that everyone in your household washes their hands often, too.  Wound care   If you have a wound, wash your hands before and after changing your bandage (dressing). Follow your health care provider's instructions for wound care.   Clean wounds, cuts, and abrasions with soap and water and cover them with dry, germ-free (sterile) dressings until they heal.   Check your wound every day for signs of  infection. Check for:  ? More redness, swelling, or pain.  ? More fluid or blood.  ? Warmth.  ? Pus or a bad smell.   Ask your health care provider if you should have a test (wound culture) for MRSA and other bacteria.  General instructions   Clean surfaces regularly to remove germs (disinfection). Use products or solutions that contain bleach. Make sure you disinfect bathroom surfaces, food preparation areas, and doorknobs.   Wash towels, bedding, and clothes in the washing machine with detergent and hot water. Dry them in a hot dryer.   Always shower after playing sports or exercising.   Avoid close contact with those around you as much as possible. Do not use towels, razors, toothbrushes, bedding, or other items that will be used by others.   If you are breastfeeding, talk to your health care provider about MRSA. You may be asked to temporarily stop breastfeeding.   Tell all your health care providers that you have MRSA.   Keep all follow-up visits as told by your health care provider. This is important.  Contact a health care provider if you:   Do not get better.   Have symptoms that get worse.   Have new symptoms.  Get help right away if you have:   Nausea, or you vomit, or you cannot take medicine without vomiting.   Trouble breathing.   Chest pain.  Summary   MRSA is an infection caused by a type of staphylococcus (staph) bacteria that does not respond to common antibiotic medicines.   Treatment for this condition depends on the type of MRSA infection and how severe, deep, and extensive the infection is.   Make sure you know the signs of a MRSA infection and when to contact a health care provider.  This information is not intended to replace advice given to you by your health care provider. Make sure you discuss any questions you have with your health care provider.  Document Released: 03/29/2005 Document Revised: 05/11/2017 Document Reviewed: 05/11/2017  Elsevier Interactive Patient  Education  2019 [employer]

## 2018-07-28 NOTE — Progress Notes (Signed)
E Visit for Rash  We are sorry that you are not feeling well. Here is how we plan to help!  Doxycycline 100 mg twice per day for 7 days   HOME CARE:   Take cool showers and avoid direct sunlight.  Apply cool compress or wet dressings.  Take a bath in an oatmeal bath.  Sprinkle content of one Aveeno packet under running faucet with comfortably warm water.  Bathe for 15-20 minutes, 1-2 times daily.  Pat dry with a towel. Do not rub the rash.  Use hydrocortisone cream.  Take an antihistamine like Benadryl for widespread rashes that itch.  The adult dose of Benadryl is 25-50 mg by mouth 4 times daily.  Caution:  This type of medication may cause sleepiness.  Do not drink alcohol, drive, or operate dangerous machinery while taking antihistamines.  Do not take these medications if you have prostate enlargement.  Read package instructions thoroughly on all medications that you take.  GET HELP RIGHT AWAY IF:   Symptoms don't go away after treatment.  Severe itching that persists.  If you rash spreads or swells.  If you rash begins to smell.  If it blisters and opens or develops a yellow-brown crust.  You develop a fever.  You have a sore throat.  You become short of breath.  MAKE SURE YOU:  Understand these instructions. Will watch your condition. Will get help right away if you are not doing well or get worse.  Thank you for choosing an e-visit. Your e-visit answers were reviewed by a board certified advanced clinical practitioner to complete your personal care plan. Depending upon the condition, your plan could have included both over the counter or prescription medications. Please review your pharmacy choice. Be sure that the pharmacy you have chosen is open so that you can pick up your prescription now.  If there is a problem you may message your provider in MyChart to have the prescription routed to another pharmacy. Your safety is important to us. If you have drug  allergies check your prescription carefully.  For the next 24 hours, you can use MyChart to ask questions about today's visit, request a non-urgent call back, or ask for a work or school excuse from your e-visit provider. You will get an email in the next two days asking about your experience. I hope that your e-visit has been valuable and will speed your recovery.      

## 2018-08-22 ENCOUNTER — Other Ambulatory Visit: Payer: Self-pay | Admitting: Physician Assistant

## 2018-08-22 DIAGNOSIS — A4902 Methicillin resistant Staphylococcus aureus infection, unspecified site: Secondary | ICD-10-CM

## 2018-08-22 MED ORDER — DOXYCYCLINE HYCLATE 100 MG PO TABS: 100.0000 mg | ORAL_TABLET | Freq: Two times a day (BID) | ORAL | 0 refills | Status: DC

## 2018-09-29 NOTE — Progress Notes (Signed)
Greater than 5 minutes, yet less than 10 minutes of time have been spent researching, coordinating, and implementing care for this patient today.  Thank you for the details you included in the comment boxes. Those details are very helpful in determining the best course of treatment for you and help us to provide the best care.  

## 2019-04-05 ENCOUNTER — Other Ambulatory Visit: Payer: Self-pay | Admitting: Physician Assistant

## 2019-04-05 DIAGNOSIS — A4902 Methicillin resistant Staphylococcus aureus infection, unspecified site: Secondary | ICD-10-CM

## 2019-08-28 ENCOUNTER — Other Ambulatory Visit: Payer: Self-pay | Admitting: Physician Assistant

## 2019-08-28 DIAGNOSIS — A4902 Methicillin resistant Staphylococcus aureus infection, unspecified site: Secondary | ICD-10-CM

## 2019-08-28 NOTE — Telephone Encounter (Signed)
Requested medications are due for refill today?  Yes - This medication is not assigned to a protocol.    Requested medications are on active medication list?  Yes     Last Refill:   04/09/2019   # 22 grams with one refill   Future visit scheduled?  No   Notes to Clinic:  This medication is not assigned to a protocol.  Patient has not had a valid encounter within the past year.  Patient's last visit was 07/28/2018.

## 2019-08-29 NOTE — Telephone Encounter (Signed)
LOV  Date 07/28/18 MRSA  LRF  Date    04/09/19        #       22g          RF 1  Last labs  Date

## 2020-01-23 ENCOUNTER — Other Ambulatory Visit: Payer: Self-pay

## 2020-01-23 ENCOUNTER — Encounter: Payer: Self-pay | Admitting: Physician Assistant

## 2020-01-23 ENCOUNTER — Ambulatory Visit
Admission: RE | Admit: 2020-01-23 | Discharge: 2020-01-23 | Disposition: A | Discharge status: Home / Self Care | Payer: BC Managed Care – PPO | Attending: Physician Assistant | Admitting: Physician Assistant

## 2020-01-23 ENCOUNTER — Ambulatory Visit (INDEPENDENT_AMBULATORY_CARE_PROVIDER_SITE_OTHER): Payer: BC Managed Care – PPO | Admitting: Physician Assistant

## 2020-01-23 ENCOUNTER — Ambulatory Visit
Admission: RE | Admit: 2020-01-23 | Discharge: 2020-01-23 | Disposition: A | Discharge status: Home / Self Care | Payer: BC Managed Care – PPO | Source: Ambulatory Visit | Attending: Physician Assistant | Admitting: Physician Assistant

## 2020-01-23 VITALS — BP 142/86 | HR 102 | Temp 99.0°F | Resp 16 | Ht 65.0 in | Wt 233.8 lb

## 2020-01-23 DIAGNOSIS — M25531 Pain in right wrist: Secondary | ICD-10-CM

## 2020-01-23 MED ORDER — METHYLPREDNISOLONE 4 MG PO TBPK: ORAL_TABLET | ORAL | 0 refills | Status: AC

## 2020-01-23 NOTE — Patient Instructions (Addendum)
Cock-up wrist splint for right wrist  Ice during lunch and after work    Wrist Pain, Adult There are many things that can cause wrist pain. Some common causes include:  An injury to the wrist area, such as a sprain, strain, or fracture.  Overuse of the joint.  A condition that causes increased pressure on a nerve in the wrist (carpal tunnel syndrome).  Wear and tear of the joints that occurs with aging (osteoarthritis).  A variety of other types of arthritis. Sometimes, the cause of wrist pain is not known. Often, the pain goes away when you follow instructions from your health care provider for relieving pain at home, such as resting or icing the wrist. If your wrist pain continues, it is important to tell your health care provider. Follow these instructions at home:  Rest the wrist area for at least 48 hours or as long as told by your health care provider.  If a splint or elastic bandage has been applied, use it as told by your health care provider. ? Remove the splint or bandage only as told by your health care provider. ? Loosen the splint or bandage if your fingers tingle, become numb, or turn cold or blue.  If directed, apply ice to the injured area. ? If you have a removable splint or elastic bandage, remove it as told by your health care provider. ? Put ice in a plastic bag. ? Place a towel between your skin and the bag or between your splint or bandage and the bag. ? Leave the ice on for 20 minutes, 2-3 times a day.   Keep your arm raised (elevated) above the level of your heart while you are sitting or lying down.  Take over-the-counter and prescription medicines only as told by your health care provider.  Keep all follow-up visits as told by your health care provider. This is important. Contact a health care provider if:  You have a sudden sharp pain in the wrist, hand, or arm that is different or new.  The swelling or bruising on your wrist or hand gets  worse.  Your skin becomes red, gets a rash, or has open sores.  Your pain does not get better or it gets worse. Get help right away if:  You lose feeling in your fingers or hand.  Your fingers turn white, very red, or cold and blue.  You cannot move your fingers.  You have a fever or chills. This information is not intended to replace advice given to you by your health care provider. Make sure you discuss any questions you have with your health care provider. Document Revised: 03/11/2017 Document Reviewed: 10/16/2015 Elsevier Patient Education  2020 [employer].

## 2020-01-23 NOTE — Progress Notes (Signed)
Established patient visit   Patient: Laurie Wilkins   DOB: 1970/07/13   49 y.o. Female  MRN: [id number] Visit Date: 01/23/2020  Today's healthcare provider: Margaretann Loveless, PA-C   Chief Complaint  Patient presents with  . Wrist Pain   Subjective    Wrist Pain  The pain is present in the right wrist. This is a new (Reports that she was playing with her son and throwing water at each other but while he was throwing water the Yeti slipped and hit her wrist.) problem. The problem occurs constantly. The quality of the pain is described as aching (throbbing burning pain). Associated symptoms include tingling (in her ring finger). Pertinent negatives include no inability to bear weight, limited range of motion, numbness or stiffness. Associated symptoms comments: swelling. The symptoms are aggravated by activity. She has tried cold, acetaminophen and NSAIDS for the symptoms. The treatment provided mild relief.    Patient declined influenza vaccine today.  Patient Active Problem List   Diagnosis Date Noted  . Allergic rhinitis 07/03/2009  . Infection with methicillin-resistant Staphylococcus aureus 01/16/2009   Past Medical History:  Diagnosis Date  . Allergic rhinitis        Medications: Outpatient Medications Prior to Visit  Medication Sig  . Multiple Vitamins-Minerals (MULTIVITAL-M) TABS Take by mouth.  . mupirocin ointment (BACTROBAN) 2 % APPLY TO AFFECTED AREA EVERY DAY AS NEEDED  . doxycycline (VIBRA-TABS) 100 MG tablet Take 1 tablet (100 mg total) by mouth 2 (two) times daily. (Patient not taking: Reported on 01/23/2020)  . ferrous sulfate 325 (65 FE) MG tablet Take by mouth. (Patient not taking: Reported on 01/23/2020)  . meloxicam (MOBIC) 7.5 MG tablet TAKE 1 TABLET (7.5 MG TOTAL) BY MOUTH ONCE DAILY. (Patient not taking: Reported on 01/23/2020)   No facility-administered medications prior to visit.    Review of Systems  Constitutional: Negative.    Respiratory: Negative.   Cardiovascular: Negative.   Musculoskeletal: Positive for arthralgias and joint swelling. Negative for stiffness.  Neurological: Positive for tingling (in her ring finger). Negative for numbness.    Last CBC No results found for: WBC, HGB, HCT, MCV, MCH, RDW, PLT Last metabolic panel No results found for: GLUCOSE, NA, K, CL, CO2, BUN, CREATININE, GFRNONAA, GFRAA, CALCIUM, PHOS, PROT, ALBUMIN, LABGLOB, AGRATIO, BILITOT, ALKPHOS, AST, ALT, ANIONGAP    Objective    BP (!) 142/86 (BP Location: Left Arm, Patient Position: Sitting, Cuff Size: Large)   Pulse (!) 102   Temp 99 F (37.2 C) (Oral)   Resp 16   Ht 5\' 5"  (1.651 m)   Wt 233 lb 12.8 oz (106.1 kg)   BMI 38.91 kg/m  BP Readings from Last 3 Encounters:  01/23/20 (!) 142/86  12/09/17 126/70  01/26/17 126/88   Wt Readings from Last 3 Encounters:  01/23/20 233 lb 12.8 oz (106.1 kg)  07/27/18 235 lb (106.6 kg)  12/09/17 233 lb (105.7 kg)      Physical Exam Vitals reviewed.  Constitutional:      Appearance: Normal appearance. She is well-developed.  HENT:     Head: Normocephalic and atraumatic.  Pulmonary:     Effort: Pulmonary effort is normal. No respiratory distress.  Musculoskeletal:     Right wrist: Tenderness present. No swelling, deformity, bony tenderness or snuff box tenderness. Decreased range of motion. Normal pulse.     Right hand: Swelling, tenderness and bony tenderness present. Decreased range of motion. Normal capillary refill. Normal pulse.  Left hand: Normal.     Cervical back: Normal range of motion and neck supple.  Neurological:     Mental Status: She is alert.  Psychiatric:        Behavior: Behavior normal.        Thought Content: Thought content normal.        Judgment: Judgment normal.     No results found for any visits on 01/23/20.  Assessment & Plan     1. Right wrist pain Hit wrist with metal yeti bottle with swelling and bruising along medial aspect 5th  metatarsal. Will get imaging as below. Medrol for inflammation. Advised to get a cock-up wrist splint for work. Continue icing. Call if worsening.  - DG Wrist Complete Right; Future - DG Hand Complete Right; Future - methylPREDNISolone (MEDROL) 4 MG TBPK tablet; 6 day taper; take as directed on package instructions  Dispense: 21 tablet; Refill: 0   No follow-ups on file.      Delmer Islam, PA-C, have reviewed all documentation for this visit. The documentation on 01/24/20 for the exam, diagnosis, procedures, and orders are all accurate and complete.   Reine Just  [facility] [phone] (phone) [phone] (fax)  [facility]

## 2020-01-24 ENCOUNTER — Encounter: Payer: Self-pay | Admitting: Physician Assistant

## 2020-05-09 ENCOUNTER — Other Ambulatory Visit: Payer: Self-pay | Admitting: Physician Assistant

## 2020-05-09 DIAGNOSIS — A4902 Methicillin resistant Staphylococcus aureus infection, unspecified site: Secondary | ICD-10-CM

## 2020-05-09 NOTE — Telephone Encounter (Signed)
Requested medication (s) are due for refill today:   Yes  Requested medication (s) are on the active medication list:   Yes  Future visit scheduled:   No   Last ordered: 08/29/2019 22g, 1 refill  Clinic note:   Returned because there is not a protocol assigned to this medication.  Requested Prescriptions  Pending Prescriptions Disp Refills   mupirocin ointment (BACTROBAN) 2 % [Pharmacy Med Name: MUPIROCIN 2% OINTMENT] 22 g 1    Sig: APPLY TO AFFECTED AREA EVERY DAY AS NEEDED      Off-Protocol Failed - 05/09/2020 10:24 AM      Failed - Medication not assigned to a protocol, review manually.      Passed - Valid encounter within last 12 months    Recent Outpatient Visits           3 months ago Right wrist pain   [facility] [place], [place], [place]   1 year ago MRSA (methicillin resistant Staphylococcus aureus) infection   [facility] [place], [place], [place]   2 years ago Dysuria   [facility] [place], [place], [place]   3 years ago Acute cystitis without hematuria   [facility] [place], [place], [place]   3 years ago Acute cystitis without hematuria   [facility], [place], [place]

## 2020-06-25 ENCOUNTER — Encounter: Payer: Self-pay | Admitting: Physician Assistant

## 2020-08-21 ENCOUNTER — Other Ambulatory Visit: Payer: Self-pay

## 2020-08-21 ENCOUNTER — Encounter: Payer: Self-pay | Admitting: Nurse Practitioner

## 2020-08-21 ENCOUNTER — Ambulatory Visit: Payer: BC Managed Care – PPO | Admitting: Nurse Practitioner

## 2020-08-21 VITALS — BP 126/76 | HR 100 | Temp 97.7°F | Wt 238.8 lb

## 2020-08-21 DIAGNOSIS — L03113 Cellulitis of right upper limb: Secondary | ICD-10-CM

## 2020-08-21 MED ORDER — DOXYCYCLINE HYCLATE 100 MG PO TABS
100.0000 mg | ORAL_TABLET | Freq: Two times a day (BID) | ORAL | 0 refills | Status: AC
Start: 2020-08-21 — End: 2020-08-31

## 2020-08-21 NOTE — Progress Notes (Signed)
BP 126/76   Pulse 100   Temp 97.7 F (36.5 C) (Oral)   Wt 238 lb 12.8 oz (108.3 kg)   SpO2 97%   BMI 39.74 kg/m    Subjective:    Patient ID: Laurie Wilkins, female    DOB: 10-18-1970, 50 y.o.   MRN: [id number]  HPI: ALLYCIA Wilkins is a 50 y.o. female  Chief Complaint  Patient presents with  . Rash    Patient states she went on vacation for Spring Break and she first noticed a bump on her right breast, that one went away from opening and draining and her applying her prescription. Then she noticed one under her right arm. Patient states she noticed some appear on her legs and she states she knows  to come in for an appointment, cause if not patient states they will just spread all over her body. Patient states the currently bump under her right arm is draining.   . Fatigue  . Leg Swelling    Patient states she has noticed her body feels as if it is swollen since she has had this last issue with the flare up and states she does not get the proper rest at night and she is extremely tired and states she is completely worn out.    Patient presents to clinic today with complaints of bumps that are draining fluid.  Patient states she went on spring break and noticed a bump on her breast.  The she noticed one on the back of her arm.  Then she noticed bumps on her leg. Patient has had MRSA infections before.  She has been using Bactroban cream which has helped but the one on the back of her left arm is painful, red and swollen.  The bumps are draining pus.  Patient denies fever or SOB.  She has been more fatigued since noticing the bumps.  Relevant past medical, surgical, family and social history reviewed and updated as indicated. Interim medical history since our last visit reviewed. Allergies and medications reviewed and updated.  Review of Systems  Constitutional: Positive for fatigue. Negative for fever.  Skin:       Bumps on back of right arm and leg.    Per HPI unless  specifically indicated above     Objective:    BP 126/76   Pulse 100   Temp 97.7 F (36.5 C) (Oral)   Wt 238 lb 12.8 oz (108.3 kg)   SpO2 97%   BMI 39.74 kg/m   Wt Readings from Last 3 Encounters:  08/21/20 238 lb 12.8 oz (108.3 kg)  01/23/20 233 lb 12.8 oz (106.1 kg)  07/27/18 235 lb (106.6 kg)    Physical Exam Skin:         Results for orders placed or performed in visit on 12/09/17  POCT urinalysis dipstick  Result Value Ref Range   Color, UA yellow    Clarity, UA clear    Glucose, UA Negative Negative   Bilirubin, UA negative    Ketones, UA negative    Spec Grav, UA <=1.005 (A) 1.010 - 1.025   Blood, UA non hemolyzed moderate    pH, UA 7.0 5.0 - 8.0   Protein, UA Negative Negative   Urobilinogen, UA 0.2 0.2 or 1.0 E.U./dL   Nitrite, UA negative    Leukocytes, UA Negative Negative      Assessment & Plan:   Problem List Items Addressed This Visit   None   Visit  Diagnoses    Cellulitis of right upper extremity    -  Primary   Complete course of doxycycline. Reviewed s/s to return to clinic or seek higher level of care. Poss. I/D if antibiotics do not resolve infection. Patient agrees       Follow up plan: Return if symptoms worsen or fail to improve.    A total of 20 minutes were spent on this encounter today.  When total time is documented, this includes both the face-to-face and non-face-to-face time personally spent before, during and after the visit on the date of the encounter.

## 2020-08-26 ENCOUNTER — Ambulatory Visit: Payer: BC Managed Care – PPO | Admitting: Family Medicine

## 2020-09-01 ENCOUNTER — Ambulatory Visit: Payer: BC Managed Care – PPO | Admitting: Family Medicine

## 2020-10-07 ENCOUNTER — Other Ambulatory Visit: Payer: Self-pay | Admitting: Physician Assistant

## 2020-10-07 DIAGNOSIS — A4902 Methicillin resistant Staphylococcus aureus infection, unspecified site: Secondary | ICD-10-CM

## 2021-01-05 ENCOUNTER — Other Ambulatory Visit: Payer: Self-pay | Admitting: Physician Assistant

## 2021-01-05 DIAGNOSIS — A4902 Methicillin resistant Staphylococcus aureus infection, unspecified site: Secondary | ICD-10-CM

## 2021-04-27 IMAGING — CR DG WRIST COMPLETE 3+V*R*
1 series · 4 of 4 positions shown · non-contrast
Comparison: None.

CLINICAL DATA: Pain

EXAM:
RIGHT WRIST - COMPLETE 3+ VIEW; RIGHT HAND - COMPLETE 3+ VIEW

[Series 1: dg wrist complete right · 0.14mm/px · 4 of 4 slices shown]
[im 1/4]
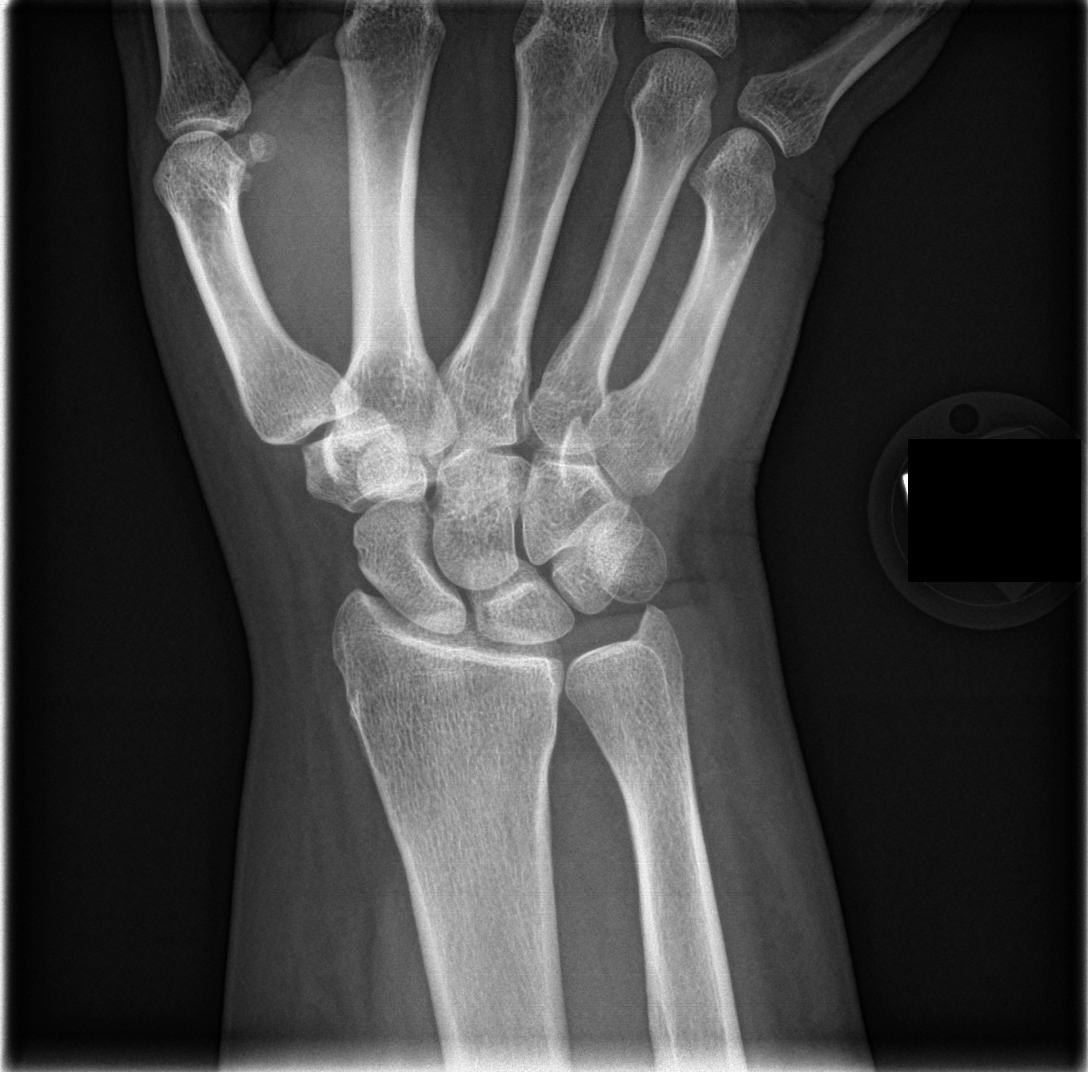
[im 2/4]
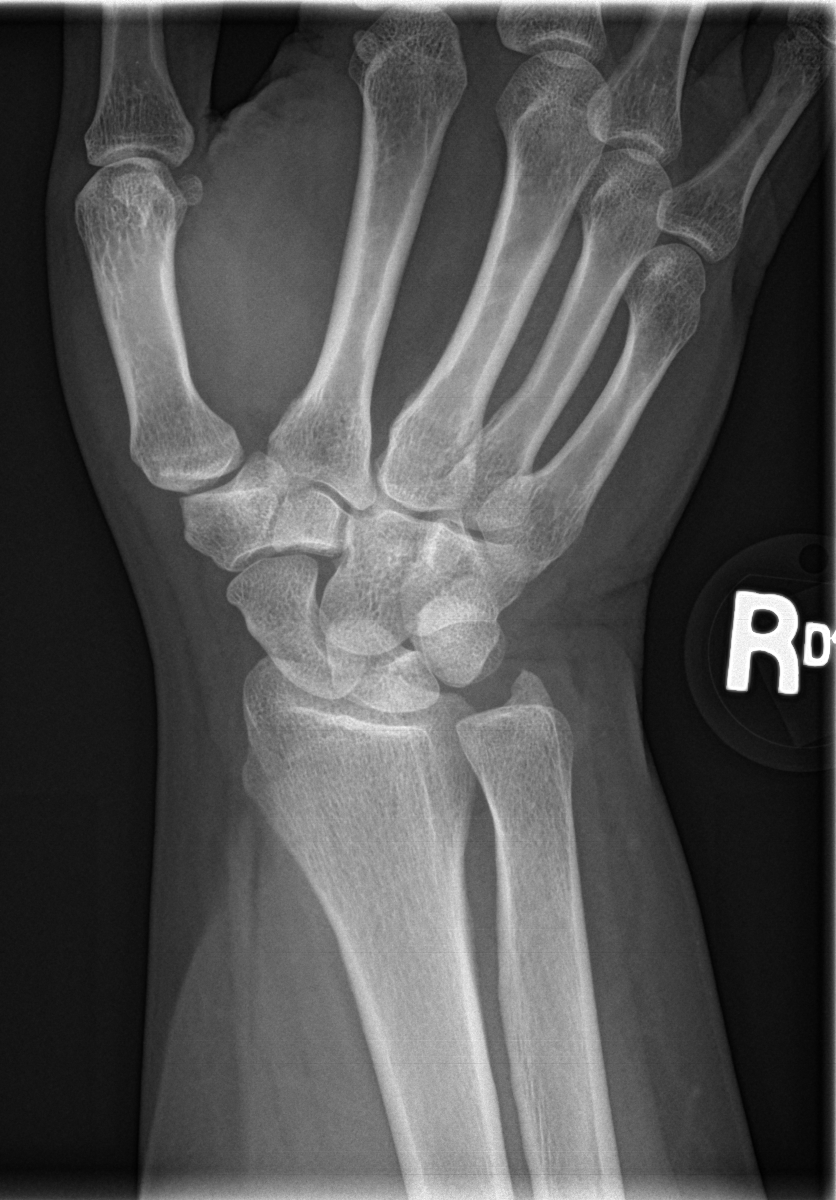
[im 3/4]
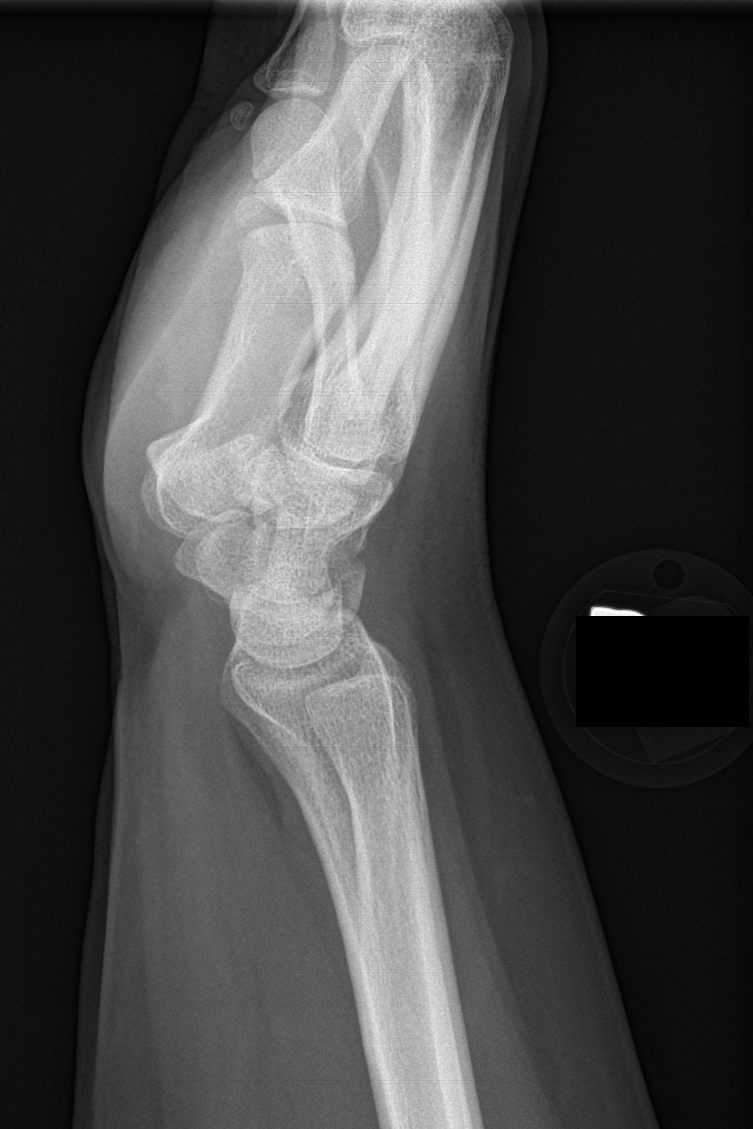
[im 4/4]
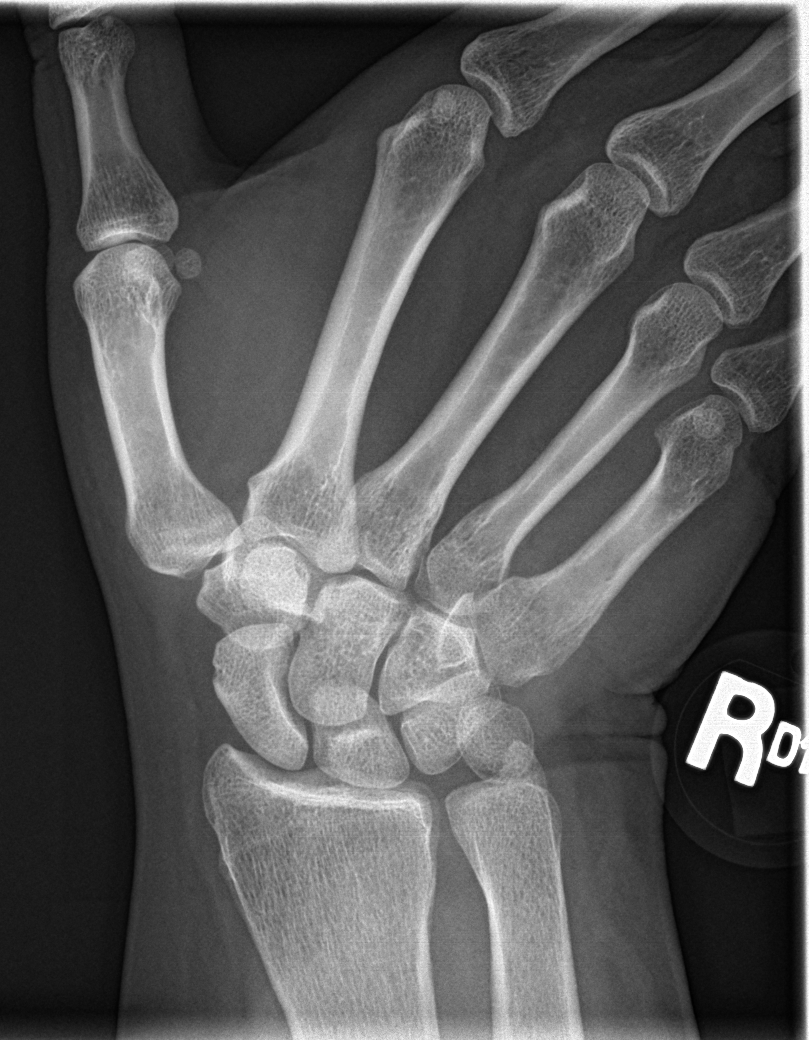

[4 of 4 positions shown; findings below may reference images not displayed]

FINDINGS: There is no evidence of fracture or dislocation. There is no
evidence of arthropathy or other focal bone abnormality. Soft
tissues are unremarkable.
IMPRESSION: Negative.
